# Patient Record
Sex: Male | Born: 1945 | Race: Black or African American | Hispanic: No | Marital: Single | Smoking: Never smoker
Health system: Southern US, Community
[De-identification: ages and names within clinical notes are randomized; demographics above are authoritative.]

---

## 2018-01-02 ENCOUNTER — Ambulatory Visit: Payer: Self-pay | Admitting: Urology

## 2018-03-17 ENCOUNTER — Ambulatory Visit (INDEPENDENT_AMBULATORY_CARE_PROVIDER_SITE_OTHER): Payer: Medicare Other | Admitting: Urology

## 2018-03-17 DIAGNOSIS — N401 Enlarged prostate with lower urinary tract symptoms: Secondary | ICD-10-CM

## 2018-03-17 DIAGNOSIS — C61 Malignant neoplasm of prostate: Secondary | ICD-10-CM

## 2018-03-17 DIAGNOSIS — R3912 Poor urinary stream: Secondary | ICD-10-CM

## 2018-03-17 DIAGNOSIS — N5201 Erectile dysfunction due to arterial insufficiency: Secondary | ICD-10-CM | POA: Diagnosis not present

## 2018-03-19 ENCOUNTER — Other Ambulatory Visit: Payer: Self-pay | Admitting: Urology

## 2018-03-19 DIAGNOSIS — C61 Malignant neoplasm of prostate: Secondary | ICD-10-CM

## 2018-05-27 ENCOUNTER — Ambulatory Visit
Admission: RE | Admit: 2018-05-27 | Discharge: 2018-05-27 | Disposition: A | Discharge status: Home / Self Care | Payer: Medicare Other | Source: Ambulatory Visit | Attending: Urology | Admitting: Urology

## 2018-05-27 DIAGNOSIS — C61 Malignant neoplasm of prostate: Secondary | ICD-10-CM

## 2019-03-09 ENCOUNTER — Ambulatory Visit: Payer: Medicare Other | Admitting: Urology

## 2019-04-20 ENCOUNTER — Other Ambulatory Visit: Payer: Self-pay

## 2019-04-20 ENCOUNTER — Other Ambulatory Visit: Payer: Self-pay | Admitting: Urology

## 2019-04-20 ENCOUNTER — Ambulatory Visit (INDEPENDENT_AMBULATORY_CARE_PROVIDER_SITE_OTHER): Payer: Medicare Other | Admitting: Urology

## 2019-04-20 VITALS — BP 162/92 | HR 77 | Temp 97.9°F | Ht 63.0 in | Wt 141.0 lb

## 2019-04-20 DIAGNOSIS — R972 Elevated prostate specific antigen [PSA]: Secondary | ICD-10-CM

## 2019-04-20 DIAGNOSIS — Z7689 Persons encountering health services in other specified circumstances: Secondary | ICD-10-CM | POA: Diagnosis not present

## 2019-04-20 LAB — PSA: PSA: 14 ng/mL — ABNORMAL HIGH (ref <=4.0)

## 2019-04-20 LAB — POCT URINALYSIS DIPSTICK
Blood, UA: NEGATIVE
Glucose, UA: NEGATIVE
Ketones, UA: NEGATIVE
Leukocytes, UA: NEGATIVE
Nitrite, UA: NEGATIVE
Protein, UA: NEGATIVE
Spec Grav, UA: 1.025 (ref 1.010–1.025)
Urobilinogen, UA: NEGATIVE E.U./dL — AB
pH, UA: 7 (ref 5.0–8.0)

## 2019-04-20 MED ORDER — LEVOFLOXACIN 750 MG PO TABS: 750.0000 mg | ORAL_TABLET | Freq: Every day | ORAL | 0 refills | Status: AC

## 2019-04-20 NOTE — Patient Instructions (Signed)
Prostate Biopsy Instructions  Stop all aspirin or blood thinners (aspirin, plavix, coumadin, warfarin, motrin, ibuprofen, advil, aleve, naproxen, naprosyn) for 7 days prior to the procedure.  If you have any questions about stopping these medications, please contact your primary care physician or cardiologist.  Having a light meal prior to the procedure is recommended.  If you are diabetic or have low blood sugar please bring a small snack or glucose tablet.  A Fleets enema is needed to be purchased over the counter at a local pharmacy and used 2 hours before you scheduled appointment.  This can be purchased over the counter at any pharmacy.  Antibiotics will be administered in the clinic at the time of the procedure unless otherwise specified.    Please bring someone with you to the procedure to drive you home.  A follow up appointment has been scheduled for you to receive the results of the biopsy.  If you have any questions or concerns, please feel free to call the office at [phone] or send a Mychart message.    Thank you, Staff at [facility]

## 2019-04-20 NOTE — Progress Notes (Signed)
H&P  Chief Complaint: Prostate Cancer  History of Present Illness:   1.12.2021: Here today for follow-up having had no treatment or management of his prostate cancer following diagnosis several years prior. He is here today largely on recommendation of his PCP. He was unable to get prostate MRI due to claustrophobia.   IPSS Questionnaire (AUA-7): Over the past month.   1)  How often have you had a sensation of not emptying your bladder completely after you finish urinating?  0 - Not at all  2)  How often have you had to urinate again less than two hours after you finished urinating? 2 - Less than half the time  3)  How often have you found you stopped and started again several times when you urinated?  0 - Not at all  4) How difficult have you found it to postpone urination?  0 - Not at all  5) How often have you had a weak urinary stream?  0 - Not at all  6) How often have you had to push or strain to begin urination?  0 - Not at all  7) How many times did you most typically get up to urinate from the time you went to bed until the time you got up in the morning?  2 - 2 times  Total score:  0-7 mildly symptomatic   8-19 moderately symptomatic   20-35 severely symptomatic     (below copied from [name]):  Danny Jordan is a 74 year-old male patient who was referred by Dr. Weldon Picking C. 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  The patient complains of lower urinary tract symptom(s) that include frequency, weak stream, intermittency, straining, and sense of incomplete emptying. The patient states his most bothersome symptom(s) are the following: sense of incomplete emptying. The patient states if he were to spend the rest of his life with his current urinary condition, he would be unhappy.  He does not have the pathology report from his biopsy.   I do not have complete records, but apparently the patient underwent ultrasound and  biopsy of his prostate by Dr. Exie Parody before 2017. At that time, PSA was elevated around 15. Prostatic volume was 96 mL. I do not have results of pathology, but apparently he had GS 3+3 pattern. He was referred for radiotherapy but it does not seem he ever underwent this. He has not been followed since 2017. Most recent PSA was 14.9 in August a of this year.     Home Medications:  Allergies as of 04/20/2019      Reactions   Medical Provider Ez Flu Shot [influenza Vac Split Quad]       Medication List       Accurate as of April 20, 2019 11:59 AM. If you have any questions, ask your nurse or doctor.        omeprazole 20 MG capsule Commonly known as: PRILOSEC Take 20 mg by mouth daily.   tamsulosin 0.4 MG Caps capsule Commonly known as: FLOMAX       Allergies:  Allergies  Allergen Reactions  . Medical Provider Ez Flu Shot [Influenza Vac Split Quad]     No family history on file.  Social History:  has no history on file for tobacco, alcohol, and drug.  ROS: A complete review of systems was performed.  All systems are negative except for pertinent findings as noted.  Physical Exam:  Vital signs in last 24 hours: BP (!) 162/92   Pulse 77   Temp 97.9 F (36.6 C)   Ht 5\' 3"  (1.6 m)   Wt 141 lb (64 kg)   BMI 24.98 kg/m  Constitutional:  Alert and oriented, No acute distress Cardiovascular: Regular rate  Respiratory: Normal respiratory effort GI: Abdomen is soft, nontender, nondistended, no abdominal masses. No CVAT. Bilateral inguinal hernias. Genitourinary: Normal male phallus (uncircumcised), testes are descended bilaterally and non-tender and without masses, scrotum is normal in appearance without lesions or masses, perineum is normal on inspection. Prostate feels around 80 grams, no nodules. Lymphatic: No lymphadenopathy Neurologic: Grossly intact, no focal deficits Psychiatric: Normal mood and affect  Laboratory Data:  No results for input(s): WBC, HGB, HCT, PLT  in the last 72 hours.  No results for input(s): NA, K, CL, GLUCOSE, BUN, CALCIUM, CREATININE in the last 72 hours.  Invalid input(s): CO3   Results for orders placed or performed in visit on 04/20/19 (from the past 24 hour(s))  POCT urinalysis dipstick     Status: Abnormal   Collection Time: 04/20/19 11:01 AM  Result Value Ref Range   Color, UA yellow    Clarity, UA clear    Glucose, UA Negative Negative   Bilirubin, UA small    Ketones, UA neg    Spec Grav, UA 1.025 1.010 - 1.025   Blood, UA neg    pH, UA 7.0 5.0 - 8.0   Protein, UA Negative Negative   Urobilinogen, UA negative (A) 0.2 or 1.0 E.U./dL   Nitrite, UA neg    Leukocytes, UA Negative Negative   Appearance clear    Odor     No results found for this or any previous visit (from the past 240 hour(s)).  Renal Function: No results for input(s): CREATININE in the last 168 hours. CrCl cannot be calculated (No successful lab value found.).  Radiologic Imaging: No results found.  Impression/Assessment:  He has had no management of his PCa since diagnosis prior to 2017. He also has not had recent PSA.   Plan:  1. PSA today -- will forward results  2. Will forgo MRI for now given he was entirely unable to tolerate his previous one.  3. Prostate [place] bx -- will call to schedule. Given instructions per nurse and we discussed the procedure as well as its possible risks and complications.

## 2019-04-21 ENCOUNTER — Other Ambulatory Visit: Payer: Self-pay | Admitting: Urology

## 2019-04-23 NOTE — Progress Notes (Signed)
Called. No answer. No way to leave message.

## 2019-04-27 ENCOUNTER — Telehealth: Payer: Self-pay

## 2019-04-27 NOTE — Telephone Encounter (Signed)
-----   Message from Franchot Gallo, MD sent at 04/23/2019 12:07 PM EST ----- Notify pt psa still elevated --14--will proceed with Bx

## 2019-04-27 NOTE — Progress Notes (Signed)
Called pt again. No answer. Letter mailed.

## 2019-05-18 ENCOUNTER — Other Ambulatory Visit: Payer: Self-pay

## 2019-05-18 ENCOUNTER — Other Ambulatory Visit: Payer: Self-pay | Admitting: Urology

## 2019-05-18 ENCOUNTER — Ambulatory Visit (HOSPITAL_COMMUNITY)
Admission: RE | Admit: 2019-05-18 | Discharge: 2019-05-18 | Disposition: A | Discharge status: Home / Self Care | Payer: Medicare Other | Source: Ambulatory Visit | Attending: Urology | Admitting: Urology

## 2019-05-18 DIAGNOSIS — R972 Elevated prostate specific antigen [PSA]: Secondary | ICD-10-CM | POA: Insufficient documentation

## 2019-05-18 DIAGNOSIS — Z7689 Persons encountering health services in other specified circumstances: Secondary | ICD-10-CM | POA: Diagnosis not present

## 2019-05-18 MED ORDER — GENTAMICIN SULFATE 40 MG/ML IJ SOLN
160.0000 mg | Freq: Once | INTRAMUSCULAR | Status: AC
  Administered 2019-05-18: 160 mg via INTRAMUSCULAR

## 2019-05-18 MED ORDER — GENTAMICIN SULFATE 40 MG/ML IJ SOLN
INTRAMUSCULAR | Status: AC
  Filled 2019-05-18: qty 4

## 2019-05-18 MED ORDER — LIDOCAINE HCL (PF) 2 % IJ SOLN
INTRAMUSCULAR | Status: AC
  Filled 2019-05-18: qty 10

## 2019-05-18 NOTE — Op Note (Signed)
This man presents for TRUS/Bx.  Reason for procedure: Prostate cancer surveillance Prostate exam: 100 gm benign  Appropriate timeout was performed. The patient was placed in the left lateral decubitus position.  Transrectal ultrasound probe was passed without difficulty. Prostate was scanned in transverse and sagittal dimensions.  Volume was obtained--130 ml. Ultrasound findings: Low bladder volume. No hypoechoic areas. Large central adenoma. Using a spinal needle, 10 mL of 2% plain lidocaine was utilized to infiltrate the lateral aspect of the seminal vesicles bilaterally. Following this, 12 cores were taken using the biopsy gun, and the following distribution: Right base lateral, right base medial, right mid lateral, right mid medial, right apex lateral, right apex medial, left base lateral, left base medial, left mid lateral, left mid medial, left apex lateral, left apex medial.  These were all sent for pathologic review separately in formalin.  At this point, the probe was removed.  Pressure was placed on the anal area for approximately 1 minute.  After establishing no significant bleeding, the procedure was then terminated.  The patient tolerated the procedure well. He was given post-boipsy instructions. We will call with pathology results and followup.

## 2019-07-27 ENCOUNTER — Other Ambulatory Visit: Payer: Self-pay

## 2019-07-27 ENCOUNTER — Ambulatory Visit (INDEPENDENT_AMBULATORY_CARE_PROVIDER_SITE_OTHER): Payer: Medicare Other | Admitting: Urology

## 2019-07-27 ENCOUNTER — Encounter: Payer: Self-pay | Admitting: Urology

## 2019-07-27 VITALS — BP 134/72 | HR 83 | Temp 98.8°F | Ht 63.0 in | Wt 141.0 lb

## 2019-07-27 DIAGNOSIS — C61 Malignant neoplasm of prostate: Secondary | ICD-10-CM

## 2019-07-27 DIAGNOSIS — R972 Elevated prostate specific antigen [PSA]: Secondary | ICD-10-CM | POA: Diagnosis not present

## 2019-07-27 DIAGNOSIS — Z7689 Persons encountering health services in other specified circumstances: Secondary | ICD-10-CM | POA: Diagnosis not present

## 2019-07-27 LAB — POCT URINALYSIS DIPSTICK
Blood, UA: NEGATIVE
Glucose, UA: NEGATIVE
Ketones, UA: NEGATIVE
Leukocytes, UA: NEGATIVE
Nitrite, UA: NEGATIVE
Protein, UA: POSITIVE — AB
Spec Grav, UA: >=1.03 — AB (ref 1.010–1.025)
Urobilinogen, UA: NEGATIVE E.U./dL — AB
pH, UA: 5 (ref 5.0–8.0)

## 2019-07-27 MED ORDER — TAMSULOSIN HCL 0.4 MG PO CAPS: 0.4000 mg | ORAL_CAPSULE | Freq: Every day | ORAL | 3 refills | Status: DC

## 2019-07-27 NOTE — Progress Notes (Signed)
H&P  Chief Complaint: Prostate Cancer + BPH  History of Present Illness:   4.20.2021: Here today for follow-up. He underwent repeat TRUSP/bx 2 mo ago, which revealed only 1/12 cores 10% positive for Gleason 3+3 cancer (very low risk group).   (below copied from [facility] records):  BPH:  Dagem Lazer is a 74 year-old male patient who was referred by Dr. Weldon Picking C. 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  The patient complains of lower urinary tract symptom(s) that include frequency, weak stream, intermittency, straining, and sense of incomplete emptying. The patient states his most bothersome symptom(s) are the following: sense of incomplete emptying. The patient states if he were to spend the rest of his life with his current urinary condition, he would be unhappy.   He is on tamsulosin. IPSS 16, quality of Life score 5   Prostate Cancer: He does not have the pathology report from his biopsy.   I do not have complete records, but apparently the patient underwent ultrasound and biopsy of his prostate by Dr. Exie Parody before 2017. At that time, PSA was elevated around 15. Prostatic volume was 96 mL. I do not have results of pathology, but apparently he had GS 3+3 pattern. He was referred for radiotherapy but it does not seem he ever underwent this. He has not been followed since 2017. Most recent PSA was 14.9 in August a of this year.   ED: He does have problems obtaining and maintaining erections. No curvature of pain with erections. Libido is fairly normal. He would like to consider therapy.  No past medical history on file.   Home Medications:  Allergies as of 07/27/2019      Reactions   Medical Provider Ez Flu Shot [influenza Vac Split Quad]       Medication List       Accurate as of July 27, 2019  3:29 PM. If you have any questions, ask your nurse or doctor.        omeprazole 20 MG capsule Commonly known as: PRILOSEC Take 20  mg by mouth daily.   tamsulosin 0.4 MG Caps capsule Commonly known as: FLOMAX       Allergies:  Allergies  Allergen Reactions  . Medical Provider Ez Flu Shot [Influenza Vac Split Quad]     No family history on file.  Social History:  reports that he has never smoked. He has never used smokeless tobacco. No history on file for alcohol and drug.  ROS: Urological Symptom Review Patient is experiencing the following symptoms: None Review of Systems Gastrointestinal (upper)  : Indigestion/heartburn Gastrointestinal (lower) : Negative for lower GI symptoms Constitutional : Negative for symptoms Skin: Negative for skin symptoms Eyes: Negative for eye symptoms Ear/Nose/Throat : Negative for Ear/Nose/Throat symptoms Hematologic/Lymphatic: Negative for Hematologic/Lymphatic symptoms Cardiovascular : Negative for cardiovascular symptoms Respiratory : Negative for respiratory symptoms Endocrine: Negative for endocrine symptoms Musculoskeletal: Negative for musculoskeletal symptoms Neurological: Negative for neurological symptoms Psychologic: Negative for psychiatric symptoms    Physical Exam:  Vital signs in last 24 hours: BP 134/72   Pulse 83   Temp 98.8 F (37.1 C)   Ht 5\' 3"  (1.6 m)   Wt 141 lb (64 kg)   BMI 24.98 kg/m  Constitutional:  Alert and oriented, No acute distress Cardiovascular: Regular rate  Respiratory: Normal respiratory effort Lymphatic: No lymphadenopathy Neurologic: Grossly intact, no focal deficits Psychiatric: Normal mood and affect    Results for  orders placed or performed in visit on 07/27/19 (from the past 24 hour(s))  POCT urinalysis dipstick     Status: Abnormal   Collection Time: 07/27/19  2:34 PM  Result Value Ref Range   Color, UA dk. yellow    Clarity, UA clear    Glucose, UA Negative Negative   Bilirubin, UA small    Ketones, UA neg    Spec Grav, UA >=1.030 (A) 1.010 - 1.025   Blood, UA neg    pH, UA 5.0 5.0 - 8.0    Protein, UA Positive (A) Negative   Urobilinogen, UA negative (A) 0.2 or 1.0 E.U./dL   Nitrite, UA neg    Leukocytes, UA Negative Negative   Appearance clear    Odor      I have reviewed prior pt notes  I have reviewed notes from referring/previous physicians  I have reviewed urinalysis results  I have independently reviewed prior imaging  I have reviewed prior PSA/Bx results  Impression/Assessment:  His repeat bx this last February revealed only very low volume, very low risk prostate cancer (1/12 cores, 10%, 3+3). We will continue to monitor this.Marland Kitchen He is absolutely unable to tolerate MRI.  Plan:  1. Return in 6 mo for OV w/ PSA -- he prefers this over annual follow-up.   2. We will continue on active surveillance, though he was assured that this cancer will likely never be a significant health risk for him.

## 2019-07-27 NOTE — Addendum Note (Signed)
Addended by: Franchot Gallo on: 07/27/2019 03:41 PM   Modules accepted: Orders

## 2019-07-27 NOTE — Progress Notes (Signed)
See H& P.

## 2019-09-14 ENCOUNTER — Other Ambulatory Visit: Payer: Self-pay

## 2019-09-14 ENCOUNTER — Ambulatory Visit (INDEPENDENT_AMBULATORY_CARE_PROVIDER_SITE_OTHER): Payer: Medicare Other | Admitting: Urology

## 2019-09-14 ENCOUNTER — Encounter: Payer: Self-pay | Admitting: Urology

## 2019-09-14 VITALS — BP 144/81 | HR 70 | Temp 97.9°F | Ht 63.0 in | Wt 140.0 lb

## 2019-09-14 DIAGNOSIS — Z7689 Persons encountering health services in other specified circumstances: Secondary | ICD-10-CM | POA: Diagnosis not present

## 2019-09-14 DIAGNOSIS — R972 Elevated prostate specific antigen [PSA]: Secondary | ICD-10-CM

## 2019-09-14 LAB — POCT URINALYSIS DIPSTICK
Bilirubin, UA: NEGATIVE
Blood, UA: NEGATIVE
Glucose, UA: NEGATIVE
Ketones, UA: NEGATIVE
Nitrite, UA: NEGATIVE
Protein, UA: NEGATIVE
Spec Grav, UA: 1.025 (ref 1.010–1.025)
Urobilinogen, UA: NEGATIVE E.U./dL — AB
pH, UA: 5 (ref 5.0–8.0)

## 2019-09-14 NOTE — Addendum Note (Signed)
Addended by: Franchot Gallo on: 09/14/2019 02:42 PM   Modules accepted: Orders

## 2019-09-14 NOTE — Progress Notes (Signed)

## 2019-09-14 NOTE — Progress Notes (Signed)
H&P  Chief Complaint: Elevated PSA  History of Present Illness:   6.8.2021: Originally scheduled for 6 mo follow-up, he returns today just 2 months out from last visit. He continues on tamsulosin but reports that he has recently run out of this rx and was unable to have this refilled.   IPSS Questionnaire (AUA-7): Over the past month.   1)  How often have you had a sensation of not emptying your bladder completely after you finish urinating?  0 - Not at all  2)  How often have you had to urinate again less than two hours after you finished urinating? 1 - Less than 1 time in 5  3)  How often have you found you stopped and started again several times when you urinated?  0 - Not at all  4) How difficult have you found it to postpone urination?  0 - Not at all  5) How often have you had a weak urinary stream?  0 - Not at all  6) How often have you had to push or strain to begin urination?  0 - Not at all  7) How many times did you most typically get up to urinate from the time you went to bed until the time you got up in the morning?  4 - 4 times  Total score:  0-7 mildly symptomatic   8-19 moderately symptomatic   20-35 severely symptomatic   Total: 5 QoL: 1  No past medical history on file.    Home Medications:  Allergies as of 09/14/2019      Reactions   Medical Provider Ez Flu Shot [influenza Vac Split Quad]       Medication List       Accurate as of September 14, 2019  2:39 PM. If you have any questions, ask your nurse or doctor.        omeprazole 20 MG capsule Commonly known as: PRILOSEC Take 20 mg by mouth daily.   tamsulosin 0.4 MG Caps capsule Commonly known as: FLOMAX Take 1 capsule (0.4 mg total) by mouth daily after supper.       Allergies:  Allergies  Allergen Reactions  . Medical Provider Ez Flu Shot [Influenza Vac Split Quad]     No family history on file.  Social History:  reports that he has never smoked. He has never used smokeless tobacco. No history  on file for alcohol and drug.  ROS: A complete review of systems was performed.  All systems are negative except for pertinent findings as noted.  Physical Exam:  Vital signs in last 24 hours: BP (!) 144/81   Pulse 70   Temp 97.9 F (36.6 C)   Ht 5\' 3"  (1.6 m)   Wt 140 lb (63.5 kg)   BMI 24.80 kg/m  Constitutional:  Alert and oriented, No acute distress Cardiovascular: Regular rate  Respiratory: Normal respiratory effort  Genitourinary: Prostate feels around 100 grams, no nodularity. Lymphatic: No lymphadenopathy Neurologic: Grossly intact, no focal deficits Psychiatric: Normal mood and affect  I have reviewed prior pt notes  I have reviewed notes from referring/previous physicians  I have reviewed urinalysis results  I have reviewed prior PSA results  Impression/Assessment:  Stable urinary sx's since last seen which was just a month ago. Despite being off tamsulosin for some time now, he is not really having any significant urinary complaints. He may be able to just fully discontinue this medication.  Plan:  1. Advised to try remaining off tamsulosin  seeing as his urinary sx's are stable and non-bothersome  2. Cancel appointment in 4 mo --> he will return for OV in 6 mo    3. PSA today

## 2019-09-15 LAB — PSA: PSA: 51 ng/mL — ABNORMAL HIGH (ref <=4.0)

## 2019-09-21 ENCOUNTER — Other Ambulatory Visit: Payer: Self-pay | Admitting: Urology

## 2019-09-27 ENCOUNTER — Other Ambulatory Visit: Payer: Self-pay | Admitting: Urology

## 2019-09-27 DIAGNOSIS — R972 Elevated prostate specific antigen [PSA]: Secondary | ICD-10-CM

## 2019-09-27 MED ORDER — SULFAMETHOXAZOLE-TRIMETHOPRIM 800-160 MG PO TABS: 1.0000 | ORAL_TABLET | Freq: Two times a day (BID) | ORAL | 0 refills | Status: AC

## 2019-09-28 ENCOUNTER — Telehealth: Payer: Self-pay

## 2019-09-28 NOTE — Telephone Encounter (Signed)
Tried to reach pt again calling 3 different numbers listed . One wrong numbers. One try again later and one out of service. Unable to reach pt.

## 2019-09-28 NOTE — Progress Notes (Signed)
PSA orders faxed to [place].

## 2019-09-28 NOTE — Telephone Encounter (Signed)
Attempted to call. Phone msg said unavailable try call later. No way to leave msg.

## 2019-10-01 DIAGNOSIS — Z789 Other specified health status: Secondary | ICD-10-CM | POA: Diagnosis not present

## 2019-10-01 DIAGNOSIS — R05 Cough: Secondary | ICD-10-CM | POA: Diagnosis not present

## 2019-10-01 DIAGNOSIS — N390 Urinary tract infection, site not specified: Secondary | ICD-10-CM | POA: Diagnosis not present

## 2019-10-01 DIAGNOSIS — Z299 Encounter for prophylactic measures, unspecified: Secondary | ICD-10-CM | POA: Diagnosis not present

## 2019-10-01 DIAGNOSIS — Z7689 Persons encountering health services in other specified circumstances: Secondary | ICD-10-CM | POA: Diagnosis not present

## 2019-10-06 NOTE — Telephone Encounter (Signed)
-----   Message from Franchot Gallo, MD sent at 09/27/2019  8:51 PM EDT ----- Call pt--PSA up to 51--may well be due to infection. I sent abx in--take it for 2 weeks, I also put order in for repeat PSA in 3 weeks ----- Message ----- From: Iris Pert, LPN Sent: [id number]   8:51 AM EDT To: Franchot Gallo, MD  Please review

## 2019-10-06 NOTE — Telephone Encounter (Signed)
Letter mailed to pt at address on file.

## 2019-11-03 DIAGNOSIS — Z7689 Persons encountering health services in other specified circumstances: Secondary | ICD-10-CM | POA: Diagnosis not present

## 2019-11-03 DIAGNOSIS — Z79899 Other long term (current) drug therapy: Secondary | ICD-10-CM | POA: Diagnosis not present

## 2019-11-03 DIAGNOSIS — Z7189 Other specified counseling: Secondary | ICD-10-CM | POA: Diagnosis not present

## 2019-11-03 DIAGNOSIS — Z0000 Encounter for general adult medical examination without abnormal findings: Secondary | ICD-10-CM | POA: Diagnosis not present

## 2019-11-03 DIAGNOSIS — Z299 Encounter for prophylactic measures, unspecified: Secondary | ICD-10-CM | POA: Diagnosis not present

## 2019-11-03 DIAGNOSIS — R5383 Other fatigue: Secondary | ICD-10-CM | POA: Diagnosis not present

## 2019-11-03 DIAGNOSIS — E7800 Pure hypercholesterolemia, unspecified: Secondary | ICD-10-CM | POA: Diagnosis not present

## 2019-12-26 DIAGNOSIS — J9601 Acute respiratory failure with hypoxia: Secondary | ICD-10-CM | POA: Diagnosis not present

## 2019-12-26 DIAGNOSIS — R0602 Shortness of breath: Secondary | ICD-10-CM | POA: Diagnosis not present

## 2019-12-26 DIAGNOSIS — E7800 Pure hypercholesterolemia, unspecified: Secondary | ICD-10-CM | POA: Diagnosis not present

## 2019-12-26 DIAGNOSIS — J90 Pleural effusion, not elsewhere classified: Secondary | ICD-10-CM | POA: Diagnosis not present

## 2019-12-26 DIAGNOSIS — U071 COVID-19: Secondary | ICD-10-CM | POA: Diagnosis not present

## 2019-12-26 DIAGNOSIS — R0902 Hypoxemia: Secondary | ICD-10-CM | POA: Diagnosis not present

## 2019-12-26 DIAGNOSIS — R509 Fever, unspecified: Secondary | ICD-10-CM | POA: Diagnosis not present

## 2019-12-26 DIAGNOSIS — J1282 Pneumonia due to coronavirus disease 2019: Secondary | ICD-10-CM | POA: Diagnosis not present

## 2019-12-27 DIAGNOSIS — R0902 Hypoxemia: Secondary | ICD-10-CM | POA: Diagnosis not present

## 2019-12-28 DIAGNOSIS — R0902 Hypoxemia: Secondary | ICD-10-CM | POA: Diagnosis not present

## 2019-12-29 DIAGNOSIS — R0902 Hypoxemia: Secondary | ICD-10-CM | POA: Diagnosis not present

## 2019-12-30 DIAGNOSIS — R0902 Hypoxemia: Secondary | ICD-10-CM | POA: Diagnosis not present

## 2019-12-31 DIAGNOSIS — J90 Pleural effusion, not elsewhere classified: Secondary | ICD-10-CM | POA: Diagnosis not present

## 2019-12-31 DIAGNOSIS — R0602 Shortness of breath: Secondary | ICD-10-CM | POA: Diagnosis not present

## 2019-12-31 DIAGNOSIS — R0902 Hypoxemia: Secondary | ICD-10-CM | POA: Diagnosis not present

## 2020-01-03 DIAGNOSIS — R0902 Hypoxemia: Secondary | ICD-10-CM | POA: Diagnosis not present

## 2020-01-04 DIAGNOSIS — R0902 Hypoxemia: Secondary | ICD-10-CM | POA: Diagnosis not present

## 2020-01-05 DIAGNOSIS — J90 Pleural effusion, not elsewhere classified: Secondary | ICD-10-CM | POA: Diagnosis not present

## 2020-01-05 DIAGNOSIS — R0902 Hypoxemia: Secondary | ICD-10-CM | POA: Diagnosis not present

## 2020-01-06 DIAGNOSIS — R0902 Hypoxemia: Secondary | ICD-10-CM | POA: Diagnosis not present

## 2020-01-07 DIAGNOSIS — R0902 Hypoxemia: Secondary | ICD-10-CM | POA: Diagnosis not present

## 2020-01-19 DIAGNOSIS — Z6823 Body mass index (BMI) 23.0-23.9, adult: Secondary | ICD-10-CM | POA: Diagnosis not present

## 2020-01-19 DIAGNOSIS — E7800 Pure hypercholesterolemia, unspecified: Secondary | ICD-10-CM | POA: Diagnosis not present

## 2020-01-19 DIAGNOSIS — Z299 Encounter for prophylactic measures, unspecified: Secondary | ICD-10-CM | POA: Diagnosis not present

## 2020-01-19 DIAGNOSIS — Z8616 Personal history of COVID-19: Secondary | ICD-10-CM | POA: Diagnosis not present

## 2020-03-21 ENCOUNTER — Ambulatory Visit: Payer: Medicare Other | Admitting: Urology

## 2020-03-21 NOTE — Progress Notes (Incomplete)
H&P  Chief Complaint: Elevated PSA  History of Present Illness:   12.14.2021:  (below copied from [place] records):  1.12.2021: Danny Jordan is a 74 year-old male patient who was referred by Dr. Weldon Picking C. 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  BPH: The patient complains of lower urinary tract symptom(s) that include frequency, weak stream, intermittency, straining, and sense of incomplete emptying. The patient states his most bothersome symptom(s) are the following: sense of incomplete emptying. The patient states if he were to spend the rest of his life with his current urinary condition, he would be unhappy.   He is on tamsulosin. IPSS 16, quality of Life score 5   Prostate Cancer: He does not have the pathology report from his biopsy.   I do not have complete records, but apparently the patient underwent ultrasound and biopsy of his prostate by Dr. Exie Parody before 2017. At that time, PSA was elevated around 15. Prostatic volume was 96 mL. I do not have results of pathology, but apparently he had GS 3+3 pattern. He was referred for radiotherapy but it does not seem he ever underwent this. He has not been followed since 2017. Most recent PSA was 14.9 in August a of this year.   ED: He does have problems obtaining and maintaining erections. No curvature of pain with erections. Libido is fairly normal. He would like to consider therapy.  4.20.2021: Here today for follow-up. He underwent repeat TRUSP/bx 2 mo ago, which revealed only 1/12 cores 10% positive for Gleason 3+3 cancer (very low risk group).   6.8.2021: Originally scheduled for 6 mo follow-up, he returns today just 2 months out from last visit. He continues on tamsulosin but reports that he has recently run out of this rx and was unable to have this refilled.   Home Medications:  Allergies as of 03/21/2020      Reactions   Medical Provider Ez Flu Shot [influenza Vac Split  Quad]       Medication List       Accurate as of March 21, 2020 12:53 PM. If you have any questions, ask your nurse or doctor.        omeprazole 20 MG capsule Commonly known as: PRILOSEC Take 20 mg by mouth daily.   sulfamethoxazole-trimethoprim 800-160 MG tablet Commonly known as: BACTRIM DS Take 1 tablet by mouth 2 (two) times daily.   tamsulosin 0.4 MG Caps capsule Commonly known as: FLOMAX Take 1 capsule (0.4 mg total) by mouth daily after supper.       Allergies:  Allergies  Allergen Reactions  . Medical Provider Ez Flu Shot [Influenza Vac Split Quad]     No family history on file.  Social History:  reports that he has never smoked. He has never used smokeless tobacco. No history on file for alcohol use and drug use.  ROS: A complete review of systems was performed.  All systems are negative except for pertinent findings as noted.  Physical Exam:  Vital signs in last 24 hours: There were no vitals taken for this visit. Constitutional:  Alert and oriented, No acute distress Cardiovascular: Regular rate  Respiratory: Normal respiratory effort GI: Abdomen is soft, nontender, nondistended, no abdominal masses. No CVAT.  Genitourinary: Normal male phallus, testes are descended bilaterally and non-tender and without masses, scrotum is normal in appearance without lesions or masses, perineum is normal on inspection. Lymphatic: No lymphadenopathy Neurologic: Grossly intact, no focal  deficits Psychiatric: Normal mood and affect  Laboratory Data:  No results for input(s): WBC, HGB, HCT, PLT in the last 72 hours.  No results for input(s): NA, K, CL, GLUCOSE, BUN, CALCIUM, CREATININE in the last 72 hours.  Invalid input(s): CO3   No results found for this or any previous visit (from the past 24 hour(s)). No results found for this or any previous visit (from the past 240 hour(s)).  Renal Function: No results for input(s): CREATININE in the last 168  hours. CrCl cannot be calculated (No successful lab value found.).  Radiologic Imaging: No results found.  Impression/Assessment:  ***  Plan:  ***

## 2020-05-22 DIAGNOSIS — Z299 Encounter for prophylactic measures, unspecified: Secondary | ICD-10-CM | POA: Diagnosis not present

## 2020-05-22 DIAGNOSIS — E7800 Pure hypercholesterolemia, unspecified: Secondary | ICD-10-CM | POA: Diagnosis not present

## 2020-05-22 DIAGNOSIS — Z8616 Personal history of COVID-19: Secondary | ICD-10-CM | POA: Diagnosis not present

## 2020-05-22 DIAGNOSIS — Z789 Other specified health status: Secondary | ICD-10-CM | POA: Diagnosis not present

## 2020-08-09 DIAGNOSIS — Z6824 Body mass index (BMI) 24.0-24.9, adult: Secondary | ICD-10-CM | POA: Diagnosis not present

## 2020-08-09 DIAGNOSIS — Z79899 Other long term (current) drug therapy: Secondary | ICD-10-CM | POA: Diagnosis not present

## 2020-08-09 DIAGNOSIS — Z299 Encounter for prophylactic measures, unspecified: Secondary | ICD-10-CM | POA: Diagnosis not present

## 2020-08-09 DIAGNOSIS — Z7189 Other specified counseling: Secondary | ICD-10-CM | POA: Diagnosis not present

## 2020-08-09 DIAGNOSIS — E7800 Pure hypercholesterolemia, unspecified: Secondary | ICD-10-CM | POA: Diagnosis not present

## 2020-08-09 DIAGNOSIS — Z0000 Encounter for general adult medical examination without abnormal findings: Secondary | ICD-10-CM | POA: Diagnosis not present

## 2020-08-09 DIAGNOSIS — R5383 Other fatigue: Secondary | ICD-10-CM | POA: Diagnosis not present

## 2020-08-30 DIAGNOSIS — H40013 Open angle with borderline findings, low risk, bilateral: Secondary | ICD-10-CM | POA: Diagnosis not present

## 2020-08-30 DIAGNOSIS — Z7689 Persons encountering health services in other specified circumstances: Secondary | ICD-10-CM | POA: Diagnosis not present

## 2020-08-30 DIAGNOSIS — H25812 Combined forms of age-related cataract, left eye: Secondary | ICD-10-CM | POA: Diagnosis not present

## 2020-08-30 DIAGNOSIS — Z961 Presence of intraocular lens: Secondary | ICD-10-CM | POA: Diagnosis not present

## 2020-10-11 DIAGNOSIS — Z961 Presence of intraocular lens: Secondary | ICD-10-CM | POA: Diagnosis not present

## 2020-10-11 DIAGNOSIS — H40013 Open angle with borderline findings, low risk, bilateral: Secondary | ICD-10-CM | POA: Diagnosis not present

## 2020-10-11 DIAGNOSIS — Z7689 Persons encountering health services in other specified circumstances: Secondary | ICD-10-CM | POA: Diagnosis not present

## 2020-10-11 DIAGNOSIS — H25812 Combined forms of age-related cataract, left eye: Secondary | ICD-10-CM | POA: Diagnosis not present

## 2020-10-17 ENCOUNTER — Ambulatory Visit: Payer: Medicare Other | Admitting: Urology

## 2020-10-17 ENCOUNTER — Other Ambulatory Visit: Payer: Self-pay | Admitting: Urology

## 2021-02-08 DIAGNOSIS — R5383 Other fatigue: Secondary | ICD-10-CM | POA: Diagnosis not present

## 2021-02-08 DIAGNOSIS — E7800 Pure hypercholesterolemia, unspecified: Secondary | ICD-10-CM | POA: Diagnosis not present

## 2021-02-08 DIAGNOSIS — Z23 Encounter for immunization: Secondary | ICD-10-CM | POA: Diagnosis not present

## 2021-02-08 DIAGNOSIS — Z6824 Body mass index (BMI) 24.0-24.9, adult: Secondary | ICD-10-CM | POA: Diagnosis not present

## 2021-02-08 DIAGNOSIS — Z299 Encounter for prophylactic measures, unspecified: Secondary | ICD-10-CM | POA: Diagnosis not present

## 2021-05-19 NOTE — Progress Notes (Incomplete)
History of Present Illness: ***  No past medical history on file.  *** The histories are not reviewed yet. Please review them in the "History" navigator section and refresh this [place].  Home Medications:  Allergies as of 05/22/2021       Reactions   Medical Provider Ez Flu Shot [influenza Vac Split Quad]         Medication List        Accurate as of May 19, 2021  8:09 PM. If you have any questions, ask your nurse or doctor.          omeprazole 20 MG capsule Commonly known as: PRILOSEC Take 20 mg by mouth daily.   sulfamethoxazole-trimethoprim 800-160 MG tablet Commonly known as: BACTRIM DS Take 1 tablet by mouth 2 (two) times daily.   tamsulosin 0.4 MG Caps capsule Commonly known as: FLOMAX TAKE 1 CAPSULE DAILY AFTER SUPPER        Allergies:  Allergies  Allergen Reactions   Medical Provider Ez Flu Shot [Influenza Vac Split Quad]     No family history on file.  Social History:  reports that he has never smoked. He has never used smokeless tobacco. No history on file for alcohol use and drug use.  ROS: A complete review of systems was performed.  All systems are negative except for pertinent findings as noted.  Physical Exam:  Vital signs in last 24 hours: There were no vitals taken for this visit. Constitutional:  Alert and oriented, No acute distress Cardiovascular: Regular rate  Respiratory: Normal respiratory effort GI: Abdomen is soft, nontender, nondistended, no abdominal masses. No CVAT.  Genitourinary: Normal male phallus, testes are descended bilaterally and non-tender and without masses, scrotum is normal in appearance without lesions or masses, perineum is normal on inspection. Lymphatic: No lymphadenopathy Neurologic: Grossly intact, no focal deficits Psychiatric: Normal mood and affect  I have reviewed prior pt notes  I have reviewed notes from referring/previous physicians  I have reviewed urinalysis results  I have  independently reviewed prior imaging  I have reviewed prior PSA results  I have reviewed prior urine culture   Impression/Assessment:  ***  Plan:  ***

## 2021-05-22 ENCOUNTER — Ambulatory Visit: Payer: Medicare Other | Admitting: Urology

## 2021-05-22 DIAGNOSIS — C61 Malignant neoplasm of prostate: Secondary | ICD-10-CM

## 2021-06-23 IMAGING — US US GUIDANCE NEEDLE PLACEMENT
1 series · 14 of 21 positions shown · non-contrast
Comparison: None.

CLINICAL DATA: Prostate biopsy.

EXAM:
IR ULTRASOUND GUIDED ASPIRATION/DRAINAGE
TECHNIQUE: Ultrasound guidance was provided for prostate biopsy.

[Series 1: us guidance needle placement · 14 of 21 slices shown]
[im 1/21]
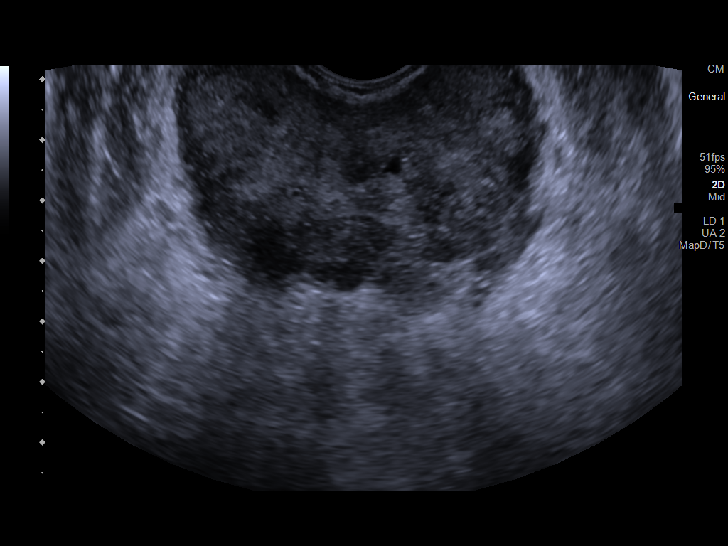
[im 3/21]
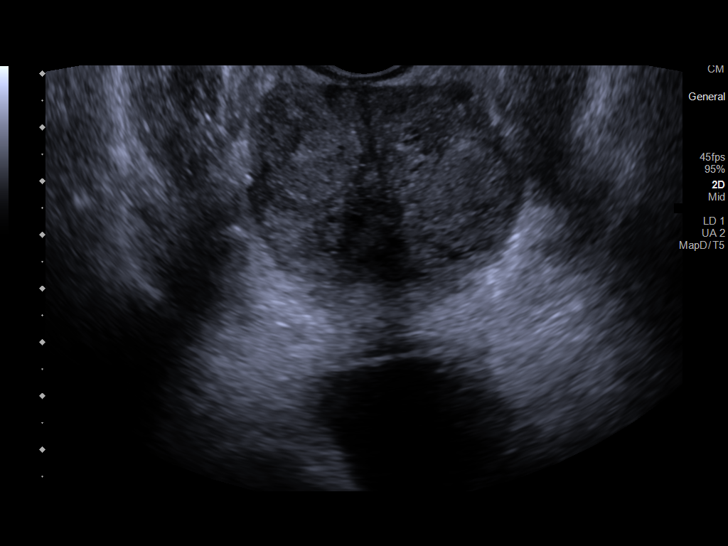
[im 4/21]
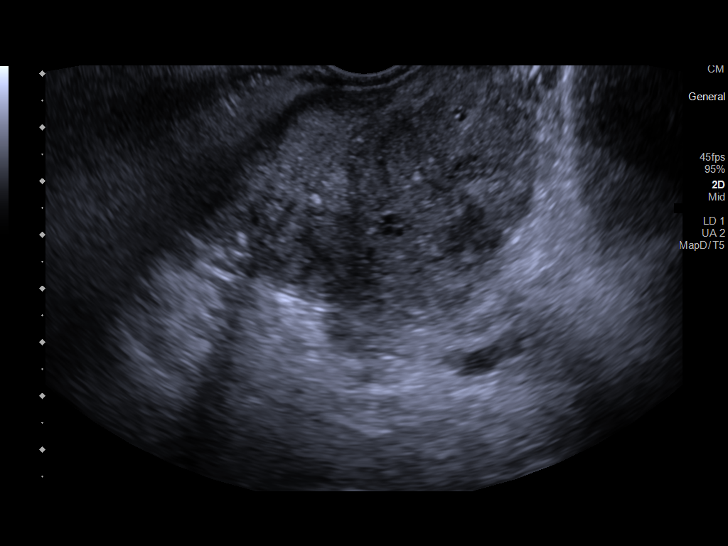
[im 6/21]
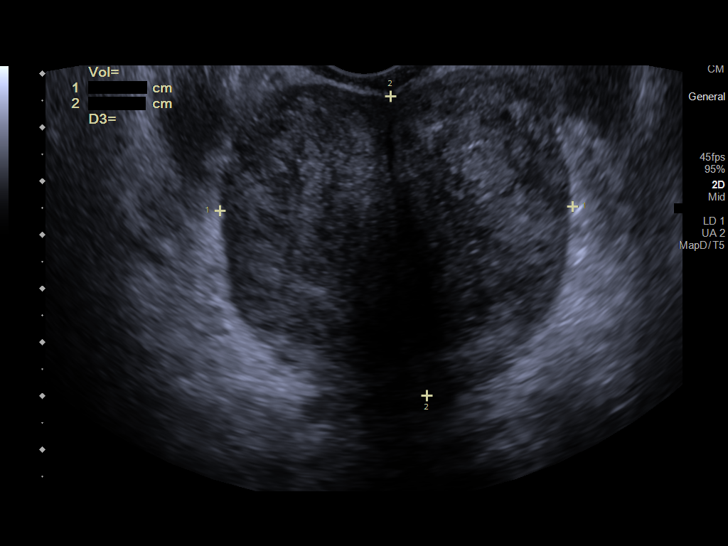
[im 7/21]
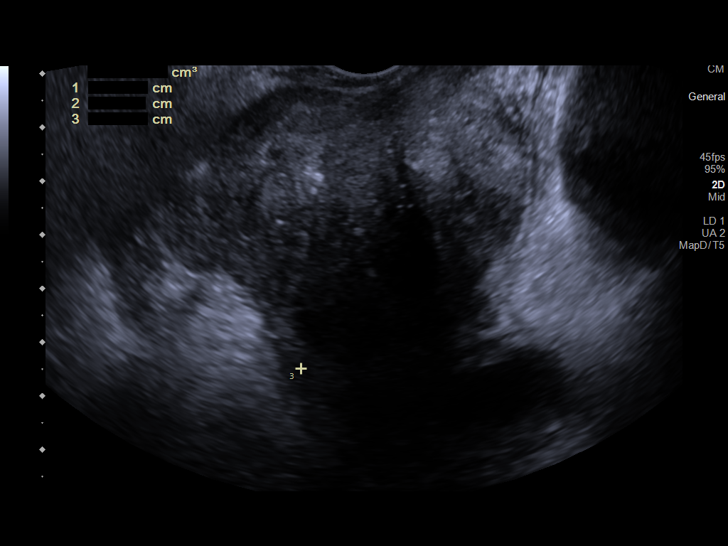
[im 9/21]
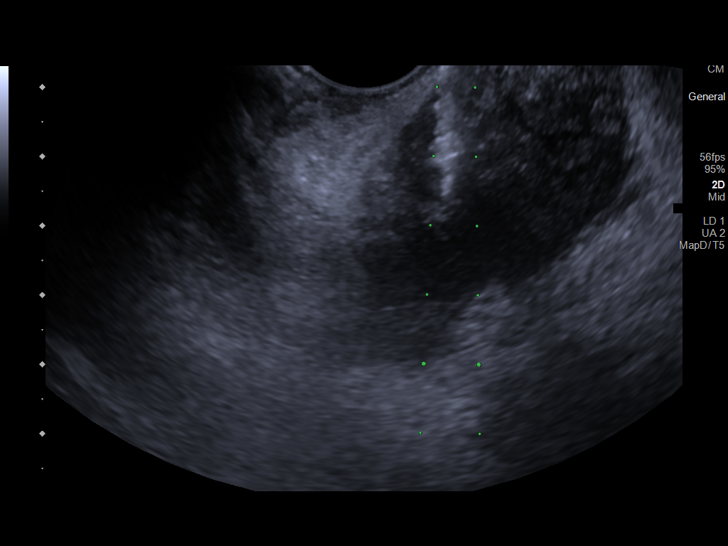
[im 10/21]
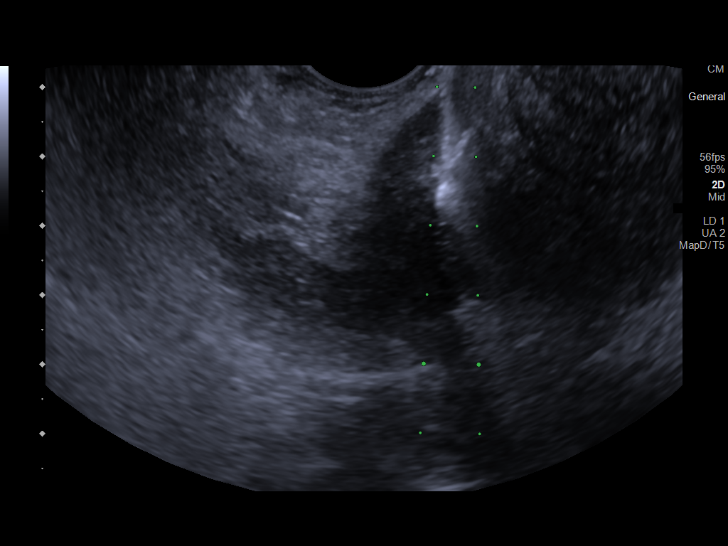
[im 12/21]
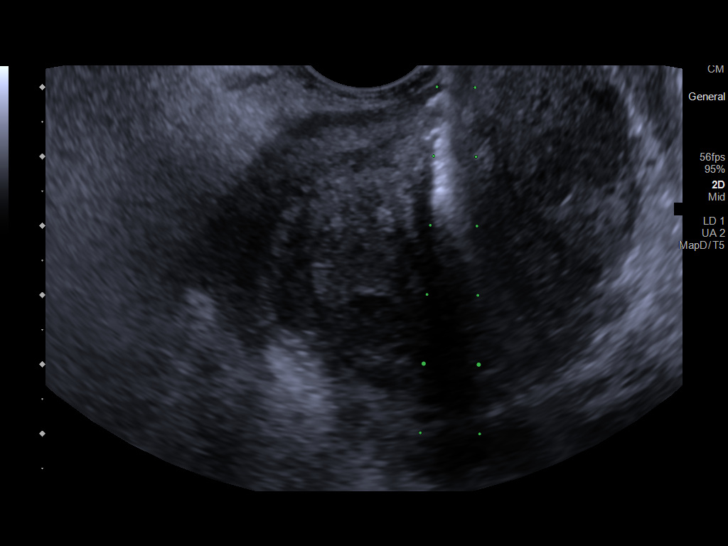
[im 13/21]
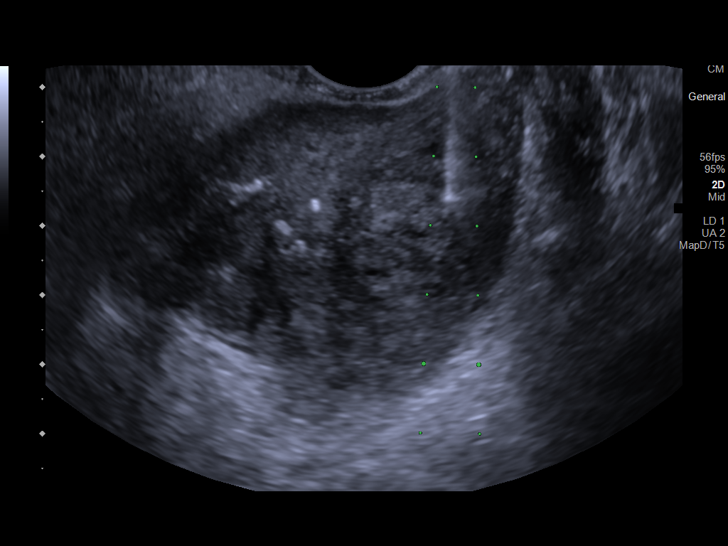
[im 15/21]
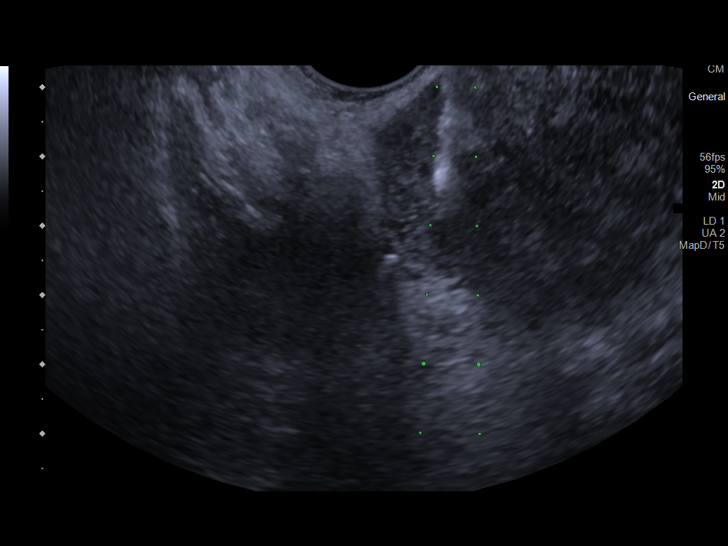
[im 16/21]
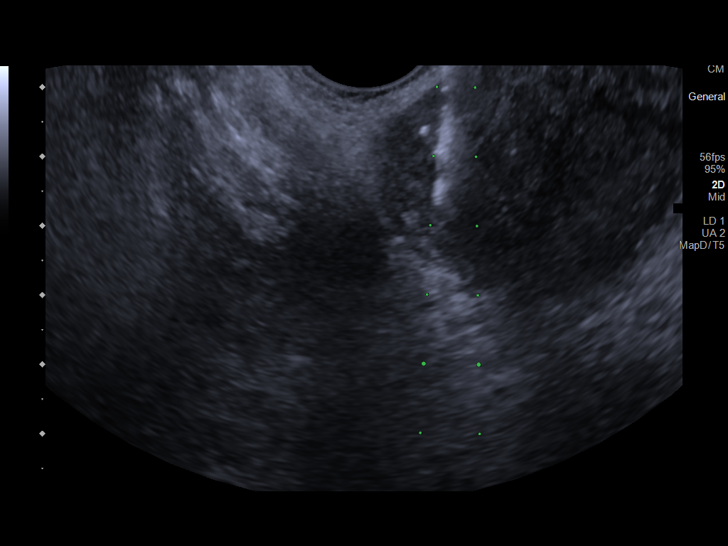
[im 18/21]
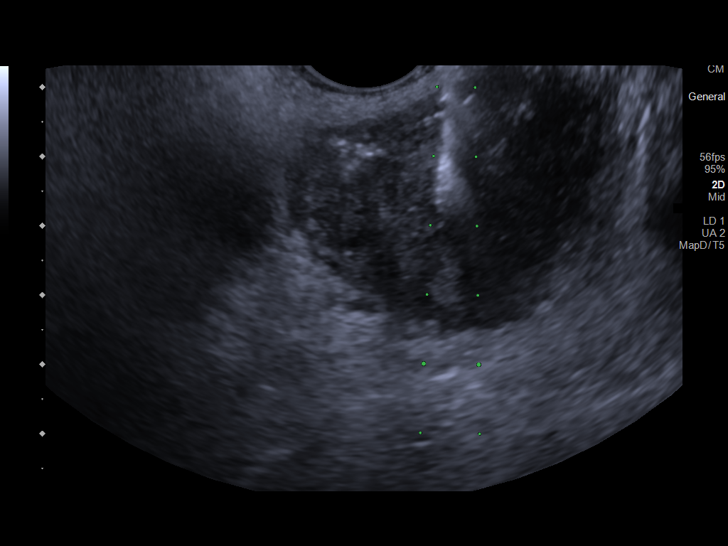
[im 19/21]
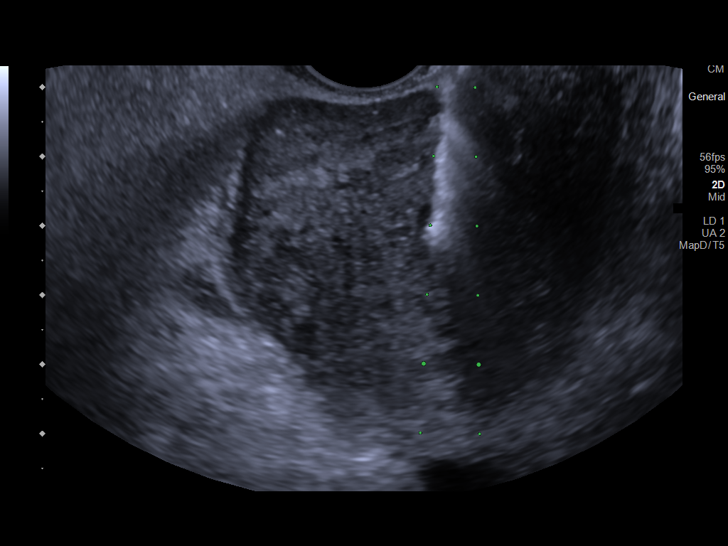
[im 21/21]
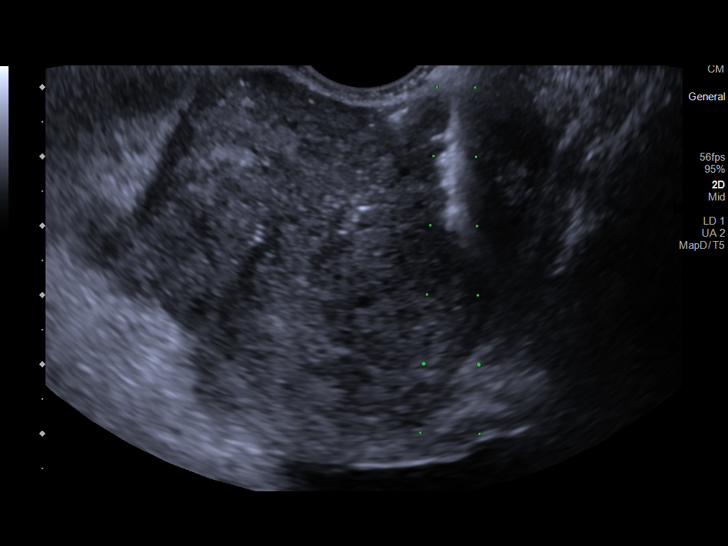

[14 of 21 positions shown; findings below may reference images not displayed]

FINDINGS: Ultrasound guidance was provided for prostate biopsy.
IMPRESSION: Ultrasound guidance was provided for prostate biopsy.

## 2021-06-25 NOTE — Progress Notes (Signed)
History of Present Illness:  ? ? ? ?BPH:  ? ?On tamsulosin. No nocturia.  IPSS 0, he is pleased with his quality of life. ?  ?  ?Prostate Cancer: ?He does not have the pathology report from his biopsy.  ?  ?I do not have complete records, but apparently the patient underwent ultrasound and biopsy of his prostate by Dr. Exie Parody before 2017. At that time, PSA was elevated around 15. Prostatic volume was 96 mL. I do not have results of pathology, but apparently he had GS 3+3 pattern.  ? ?He was referred for radiotherapy but it does not seem he ever underwent this. He has not been followed since 2017.  ? ?2.9.2021: Here today for follow-up. Repeat TRUSP/bx 2 mo ago, which revealed only 1/12 cores 10% positive for Gleason 3+3 cancer (very low risk group). Prostate volume 130 mL.  In May 2021 his PSA went up to 51.  It looks like he was treated for urinary tract infection at that time.  He has not had his PSA rechecked, or followed up here, as he said he was hospitalized for COVID. ?  ?ED: ?He does have problems obtaining and maintaining erections. No curvature of pain with erections. Libido is fairly normal. He would like to consider therapy. ?  ? ? ?  ?  ? ?Home Medications:  ?Allergies as of 06/26/2021   ? ?   Reactions  ? Medical Provider Ez Flu Shot [influenza Vac Split Quad]   ? ?  ? ?  ?Medication List  ?  ? ?  ? Accurate as of June 25, 2021  8:56 AM. If you have any questions, ask your nurse or doctor.  ?  ?  ? ?  ? ?omeprazole 20 MG capsule ?Commonly known as: PRILOSEC ?Take 20 mg by mouth daily. ?  ?sulfamethoxazole-trimethoprim 800-160 MG tablet ?Commonly known as: BACTRIM DS ?Take 1 tablet by mouth 2 (two) times daily. ?  ?tamsulosin 0.4 MG Caps capsule ?Commonly known as: FLOMAX ?TAKE 1 CAPSULE DAILY AFTER SUPPER ?  ? ?  ? ? ?Allergies:  ?Allergies  ?Allergen Reactions  ? Medical Provider Ez Flu Shot [Influenza Vac Split Quad]   ? ? ?No family history on file. ? ?Social History:  reports that he has never  smoked. He has never used smokeless tobacco. No history on file for alcohol use and drug use. ? ?ROS: ?A complete review of systems was performed.  All systems are negative except for pertinent findings as noted. ? ?Physical Exam:  ?Vital signs in last 24 hours: ?There were no vitals taken for this visit. ?Constitutional:  Alert and oriented, No acute distress ?Cardiovascular: Regular rate  ?Respiratory: Normal respiratory effort ?GI: Abdomen is soft, nontender, nondistended, no abdominal masses. No CVAT.  He has a large left inguinal hernia. ?Genitourinary: Normal male phallus, testes are descended bilaterally and non-tender and without masses, scrotum is normal in appearance without lesions or masses, perineum is normal on inspection.  Prostate greater than 120 g, symmetric, nonnodular, nontender.  Phallus is uncircumcised. ?Lymphatic: No lymphadenopathy ?Neurologic: Grossly intact, no focal deficits ?Psychiatric: Normal mood and affect ? ?I have reviewed prior pt notes ? ?I have reviewed notes from referring/previous physicians ? ?I have reviewed urinalysis results ? ?I have reviewed prior PSA/pathology results ? ? ? ?Impression/Assessment:  ?1.  BPH with very large gland, doing well on alfuzosin ? ?2.  Very low risk, low-volume prostate cancer.  Last followed in 2021.  Following his confirmatory biopsy he  did have his PSA increased to 51.  This may have been due to UTI/prostatitis ? ?Plan:  ?1.  PSA is rechecked today ? ?2.  If significantly elevated, we will need to proceed with MRI of prostate.  If back down to his "normal", I will just follow up in about a year ? ?

## 2021-06-26 ENCOUNTER — Other Ambulatory Visit: Payer: Self-pay

## 2021-06-26 ENCOUNTER — Encounter: Payer: Self-pay | Admitting: Urology

## 2021-06-26 ENCOUNTER — Ambulatory Visit (INDEPENDENT_AMBULATORY_CARE_PROVIDER_SITE_OTHER): Payer: Medicare Other | Admitting: Urology

## 2021-06-26 VITALS — BP 130/79 | HR 73

## 2021-06-26 DIAGNOSIS — N400 Enlarged prostate without lower urinary tract symptoms: Secondary | ICD-10-CM | POA: Diagnosis not present

## 2021-06-26 DIAGNOSIS — C61 Malignant neoplasm of prostate: Secondary | ICD-10-CM | POA: Diagnosis not present

## 2021-06-26 NOTE — Addendum Note (Signed)
Addended by: Dorisann Frames on: 06/26/2021 03:33 PM ? ? Modules accepted: Orders ? ?

## 2021-06-27 LAB — MICROSCOPIC EXAMINATION
Bacteria, UA: NONE SEEN
RBC, Urine: NONE SEEN /hpf (ref 0–2)
Renal Epithel, UA: NONE SEEN /hpf

## 2021-06-27 LAB — URINALYSIS, ROUTINE W REFLEX MICROSCOPIC
Bilirubin, UA: NEGATIVE
Glucose, UA: NEGATIVE
Nitrite, UA: NEGATIVE
Protein,UA: NEGATIVE
RBC, UA: NEGATIVE
Specific Gravity, UA: >1.03 — ABNORMAL HIGH (ref 1.005–1.030)
Urobilinogen, Ur: 0.2 mg/dL (ref 0.2–1.0)
pH, UA: 5.5 (ref 5.0–7.5)

## 2021-06-27 LAB — PSA: Prostate Specific Ag, Serum: 28 ng/mL — ABNORMAL HIGH (ref 0.0–4.0)

## 2021-07-03 ENCOUNTER — Other Ambulatory Visit: Payer: Self-pay | Admitting: Urology

## 2021-07-03 DIAGNOSIS — C61 Malignant neoplasm of prostate: Secondary | ICD-10-CM

## 2021-07-04 ENCOUNTER — Telehealth: Payer: Self-pay

## 2021-07-04 NOTE — Telephone Encounter (Signed)
Patient called and notified of results and scheduling will reach out to patient.  ?

## 2021-07-04 NOTE — Telephone Encounter (Signed)
-----   Message from Franchot Gallo, MD sent at 07/03/2021  8:48 AM EDT ----- ?Please call patient-PSA is still quite elevated at 28.  I would suggest that we send him for an MRI of the prostate to be done down in [place] if he needs more imaging.  I put that order in. ?----- Message ----- ?From: Dorisann Frames, RN ?Sent: 06/27/2021   4:34 PM EDT ?To: Franchot Gallo, MD ? ?Please review  ? ?

## 2021-07-25 ENCOUNTER — Inpatient Hospital Stay: Admission: RE | Admit: 2021-07-25 | Payer: Medicare Other | Source: Ambulatory Visit

## 2021-08-16 ENCOUNTER — Inpatient Hospital Stay: Admission: RE | Admit: 2021-08-16 | Payer: Medicare Other | Source: Ambulatory Visit

## 2021-10-01 DIAGNOSIS — R5383 Other fatigue: Secondary | ICD-10-CM | POA: Diagnosis not present

## 2021-10-01 DIAGNOSIS — Z0000 Encounter for general adult medical examination without abnormal findings: Secondary | ICD-10-CM | POA: Diagnosis not present

## 2021-10-01 DIAGNOSIS — Z79899 Other long term (current) drug therapy: Secondary | ICD-10-CM | POA: Diagnosis not present

## 2021-10-01 DIAGNOSIS — Z6824 Body mass index (BMI) 24.0-24.9, adult: Secondary | ICD-10-CM | POA: Diagnosis not present

## 2021-10-01 DIAGNOSIS — Z299 Encounter for prophylactic measures, unspecified: Secondary | ICD-10-CM | POA: Diagnosis not present

## 2021-10-01 DIAGNOSIS — Z1211 Encounter for screening for malignant neoplasm of colon: Secondary | ICD-10-CM | POA: Diagnosis not present

## 2021-10-01 DIAGNOSIS — Z7189 Other specified counseling: Secondary | ICD-10-CM | POA: Diagnosis not present

## 2021-10-01 DIAGNOSIS — E7800 Pure hypercholesterolemia, unspecified: Secondary | ICD-10-CM | POA: Diagnosis not present

## 2022-01-26 ENCOUNTER — Other Ambulatory Visit: Payer: Self-pay | Admitting: Urology

## 2022-01-30 ENCOUNTER — Telehealth: Payer: Self-pay | Admitting: *Deleted

## 2022-01-30 DIAGNOSIS — E7800 Pure hypercholesterolemia, unspecified: Secondary | ICD-10-CM

## 2022-01-30 NOTE — Patient Outreach (Signed)
  Care Coordination   Initial Visit Note   01/30/2022 Name: Nemiah Kissner MRN: [id number] DOB: 02-01-1946  Charlies Rayburn is a 76 y.o. year old male who sees Monico Blitz, MD for primary care. I spoke with  Vanetta Shawl by phone today.  What matters to the patients health and wellness today?  Doing well with management of chronic conditions.  Does not feel the need for further medical/nursing follow up but does request community resources.  Denies any urgent concerns, encouraged to contact this care manager with questions.      Goals Addressed             This Visit's Progress    COMPLETED: Care Coordination Activities - No follow up needed       Care Coordination Interventions: Patient interviewed about adult health maintenance status including  Pneumonia Vaccine Influenza Vaccine COVID vaccination    Regular eye checkups Regular Dental Care    Blood Pressure    SDOH assessment complete Discussed need for follow up with PCP (canceled last month's visit) Referral placed to community resource care guide for rent and utility resources         SDOH assessments and interventions completed:  Yes  SDOH Interventions Today    Flowsheet Row Most Recent Value  SDOH Interventions   Food Insecurity Interventions Intervention Not Indicated  Housing Interventions Other (Comment)  [Referral to care guide for community resources]  Transportation Interventions Intervention Not Indicated  Utilities Interventions Other (Comment)  [referral for community resources]        Care Coordination Interventions Activated:  Yes  Care Coordination Interventions:  Yes, provided   Follow up plan: Referral made to Care Guide for community resources    Encounter Outcome:  Pt. Visit Completed   Valente David, RN, MSN, [facility] Care Management Coordinator [phone]

## 2022-01-31 ENCOUNTER — Telehealth: Payer: Self-pay | Admitting: *Deleted

## 2022-01-31 NOTE — Telephone Encounter (Signed)
   Telephone encounter was:  Unsuccessful.  01/31/2022 Name: Danny Jordan MRN: [id number] DOB: Jun 25, 1945  Unsuccessful outbound call made today to assist with:  Transportation Needs  and Food Insecurity  Outreach Attempt:  1st Attempt  A HIPAA compliant voice message was left requesting a return call.  Instructed patient to call back at [phone].  [place] [phone] [address] , [place] [address] Email : Ashby Dawes. Greenauer-moran [email or web address]

## 2022-02-05 ENCOUNTER — Telehealth: Payer: Self-pay | Admitting: *Deleted

## 2022-02-05 NOTE — Telephone Encounter (Signed)
   Telephone encounter was:  Unsuccessful.  02/05/2022 Name: Danny Jordan MRN: [id number] DOB: 01/22/1946  Unsuccessful outbound call made today to assist with:  Financial Difficulties related to rent  Outreach Attempt:  2nd Attempt  A HIPAA compliant voice message was left requesting a return call.  Instructed patient to call back at [phone].  [place] [phone] [address] , [place] [address] Email : Ashby Dawes. Greenauer-moran [email or web address]

## 2022-02-06 ENCOUNTER — Telehealth: Payer: Self-pay | Admitting: *Deleted

## 2022-02-06 NOTE — Telephone Encounter (Signed)
   Telephone encounter was:  Unsuccessful.  02/06/2022 Name: Danny Jordan MRN: [id number] DOB: [birth year]  Unsuccessful outbound call made today to assist with:   rent and utility   Outreach Attempt:  2nd Attempt  A HIPAA compliant voice message was left requesting a return call.  Instructed patient to call back at [phone].  [place] [phone] [address] , [place] [address] Email : Ashby Dawes. Greenauer-moran [email or web address]

## 2022-02-08 ENCOUNTER — Telehealth: Payer: Self-pay | Admitting: *Deleted

## 2022-02-08 NOTE — Telephone Encounter (Signed)
   Telephone encounter was:  Unsuccessful.  02/08/2022 Name: Danny Jordan MRN: [id number] DOB: Jan 12, 1946  Unsuccessful outbound call made today to assist with:  Food Insecurity  Outreach Attempt:  2nd Attempt  A HIPAA compliant voice message was left requesting a return call.  Instructed patient to call back at [phone].  [place] [phone] [address] , [place] [address] Email : Ashby Dawes. Greenauer-moran [email or web address]

## 2022-02-11 ENCOUNTER — Telehealth: Payer: Self-pay | Admitting: *Deleted

## 2022-02-11 NOTE — Telephone Encounter (Signed)
   Telephone encounter was:  Successful.  02/11/2022 Name: Danny Jordan MRN: [id number] DOB: Jun 04, 1945  Danny Jordan is a 76 y.o. year old male who is a primary care patient of Monico Blitz, MD . The community resource team was consulted for assistance with Food Insecurity and Financial Difficulties related to rent  Care guide performed the following interventions: Patient provided with information about care guide support team and interviewed to confirm resource needs. Rent is 1200 and there is a few months behind . The person is a few months behind.informaed patient the his friend would need to file to get SSA in her name and all the billsare in her name so should would need to go to dss for help, Will mail food banks and housing lists  Follow Up Plan:  No further follow up planned at this time. The patient has been provided with needed resources.  [place] [phone] [address] , [place] [address] Email : Ashby Dawes. Greenauer-moran '@[email or web address]

## 2022-07-02 ENCOUNTER — Ambulatory Visit: Payer: 59 | Admitting: Urology

## 2022-07-30 ENCOUNTER — Ambulatory Visit: Payer: 59 | Admitting: Urology

## 2022-09-10 ENCOUNTER — Ambulatory Visit: Payer: 59 | Admitting: Urology

## 2022-10-02 DIAGNOSIS — H25812 Combined forms of age-related cataract, left eye: Secondary | ICD-10-CM | POA: Diagnosis not present

## 2022-10-02 DIAGNOSIS — H40013 Open angle with borderline findings, low risk, bilateral: Secondary | ICD-10-CM | POA: Diagnosis not present

## 2022-10-02 DIAGNOSIS — Z961 Presence of intraocular lens: Secondary | ICD-10-CM | POA: Diagnosis not present

## 2022-10-08 ENCOUNTER — Other Ambulatory Visit: Payer: Self-pay | Admitting: Urology

## 2022-11-01 DIAGNOSIS — Z7189 Other specified counseling: Secondary | ICD-10-CM | POA: Diagnosis not present

## 2022-11-01 DIAGNOSIS — E7800 Pure hypercholesterolemia, unspecified: Secondary | ICD-10-CM | POA: Diagnosis not present

## 2022-11-01 DIAGNOSIS — Z0000 Encounter for general adult medical examination without abnormal findings: Secondary | ICD-10-CM | POA: Diagnosis not present

## 2022-11-01 DIAGNOSIS — Z79899 Other long term (current) drug therapy: Secondary | ICD-10-CM | POA: Diagnosis not present

## 2022-11-01 DIAGNOSIS — Z299 Encounter for prophylactic measures, unspecified: Secondary | ICD-10-CM | POA: Diagnosis not present

## 2022-11-01 DIAGNOSIS — R5383 Other fatigue: Secondary | ICD-10-CM | POA: Diagnosis not present

## 2022-11-01 DIAGNOSIS — Z1211 Encounter for screening for malignant neoplasm of colon: Secondary | ICD-10-CM | POA: Diagnosis not present

## 2022-11-18 NOTE — Progress Notes (Incomplete)
History of Present Illness:   BPH:   On tamsulosin. No nocturia.  IPSS 0, he is pleased with his quality of life.     Prostate Cancer: He does not have the pathology report from his biopsy.    I do not have complete records, but apparently the patient underwent ultrasound and biopsy of his prostate by Dr. Nechama Guard before 2017. At that time, PSA was elevated around 15. Prostatic volume was 96 mL. I do not have results of pathology, but apparently he had GS 3+3 pattern.   He was referred for radiotherapy but it does not seem he ever underwent this. He has not been followed since 2017.   2.9.2021: Here today for follow-up. Repeat TRUSP/bx 2 mo ago, which revealed only 1/12 cores 10% positive for Gleason 3+3 cancer (very low risk group). Prostate volume 130 mL.  In May 2021 his PSA went up to 51.  It looks like he was treated for urinary tract infection at that time.  He has not had his PSA rechecked, or followed up here, as he said he was hospitalized for COVID.  3.21.2023: PSA 28.  It was recommended that the patient had a prostate MRI.  This was never performed.   ED: He does have problems obtaining and maintaining erections. No curvature of pain with erections. Libido is fairly normal. He would like to consider therapy.   8.13.2024:   No past medical history on file.  No past surgical history on file.  Home Medications:  Allergies as of 11/19/2022       Reactions   Medical Provider Ez Flu Shot [influenza Vac Split Quad]         Medication List        Accurate as of November 18, 2022 12:13 PM. If you have any questions, ask your nurse or doctor.          omeprazole 20 MG capsule Commonly known as: PRILOSEC Take 20 mg by mouth daily.   pravastatin 20 MG tablet Commonly known as: PRAVACHOL Take 20 mg by mouth daily.   sulfamethoxazole-trimethoprim 800-160 MG tablet Commonly known as: BACTRIM DS Take 1 tablet by mouth 2 (two) times daily.   tamsulosin 0.4 MG Caps  capsule Commonly known as: FLOMAX TAKE 1 CAPSULE BY MOUTH DAILY  AFTER SUPPER        Allergies:  Allergies  Allergen Reactions   Medical Provider Ez Flu Shot [Influenza Vac Split Quad]     No family history on file.  Social History:  reports that he has never smoked. He has never used smokeless tobacco. No history on file for alcohol use and drug use.  ROS: A complete review of systems was performed.  All systems are negative except for pertinent findings as noted.  Physical Exam:  Vital signs in last 24 hours: There were no vitals taken for this visit. Constitutional:  Alert and oriented, No acute distress Cardiovascular: Regular rate  Respiratory: Normal respiratory effort GI: Abdomen is soft, nontender, nondistended, no abdominal masses. No CVAT.  Genitourinary: Normal male phallus, testes are descended bilaterally and non-tender and without masses, scrotum is normal in appearance without lesions or masses, perineum is normal on inspection. Lymphatic: No lymphadenopathy Neurologic: Grossly intact, no focal deficits Psychiatric: Normal mood and affect  I have reviewed prior pt notes  I have reviewed notes from referring/previous physicians  I have reviewed urinalysis results  I have independently reviewed prior imaging  I have reviewed prior PSA results  I have  reviewed prior urine culture   Impression/Assessment:  ***  Plan:  ***

## 2022-11-19 ENCOUNTER — Ambulatory Visit: Payer: 59 | Admitting: Urology

## 2022-11-19 DIAGNOSIS — C61 Malignant neoplasm of prostate: Secondary | ICD-10-CM

## 2022-11-19 DIAGNOSIS — N400 Enlarged prostate without lower urinary tract symptoms: Secondary | ICD-10-CM

## 2022-12-24 ENCOUNTER — Ambulatory Visit: Payer: 59 | Admitting: Urology

## 2023-01-03 NOTE — Progress Notes (Signed)
History of Present Illness: 77 yo male presents for  contiued f/u of the below issues:  BPH:   He had prostate measurement of 130 mL on last prostate U/S.On tamsulosin.      Prostate Cancer:   I do not have complete records, but apparently the patient underwent ultrasound and biopsy of his prostate by Dr. Nechama Guard before 2017. At that time, PSA was elevated around 15. Prostatic volume was 96 mL. I do not have results of pathology, but apparently he had GS 3+3 pattern.   He was referred for radiotherapy but it does not seem he underwent this.   2.9.2021:  Repeat TRUSP/bx. Path-- only 1/12 cores 10% positive for Gleason 3+3 cancer (very low risk group). Prostate volume 130 mL.   In May 2021 his PSA went up to 51.  It looks like he was treated for urinary tract infection at that time.  He has not had his PSA rechecked, or followed up here, as he said he was hospitalized for COVID.  3.21.2023-PSA 28  ED: He has been on sildenafil in the past  9.30.2024: First visit here in well over a year.  He has not had PSA checked.  He is still on tamsulosin.  He denies significant lower urinary tract symptoms.  No blood in his urine.  No past medical history on file.  No past surgical history on file.  Home Medications:  Allergies as of 01/06/2023       Reactions   Medical Provider Ez Flu Shot [influenza Vac Split Quad]         Medication List        Accurate as of January 03, 2023 10:27 AM. If you have any questions, ask your nurse or doctor.          omeprazole 20 MG capsule Commonly known as: PRILOSEC Take 20 mg by mouth daily.   pravastatin 20 MG tablet Commonly known as: PRAVACHOL Take 20 mg by mouth daily.   sulfamethoxazole-trimethoprim 800-160 MG tablet Commonly known as: BACTRIM DS Take 1 tablet by mouth 2 (two) times daily.   tamsulosin 0.4 MG Caps capsule Commonly known as: FLOMAX TAKE 1 CAPSULE BY MOUTH DAILY  AFTER SUPPER        Allergies:  Allergies   Allergen Reactions   Medical Provider Ez Flu Shot [Influenza Vac Split Quad]     No family history on file.  Social History:  reports that he has never smoked. He has never used smokeless tobacco. No history on file for alcohol use and drug use.  ROS: A complete review of systems was performed.  All systems are negative except for pertinent findings as noted.  Physical Exam:  Vital signs in last 24 hours: There were no vitals taken for this visit. Constitutional:  Alert and oriented, No acute distress Cardiovascular: Regular rate  Respiratory: Normal respiratory effor Genitourinary: Normal anal tone.  Prostate 100 g, symmetric, nonnodular, nontender. Lymphatic: No lymphadenopathy Neurologic: Grossly intact, no focal deficits Psychiatric: Normal mood and affect  I have reviewed prior pt notes  I have reviewed urinalysis results  I have independently reviewed prior imaging-prostate ultrasound volume  I have reviewed prior PSA and pathology results     Impression/Assessment:  Grade group 1 prostate cancer, status post 2 prior biopsies.  Last confirmatory biopsy was about 3 years ago.  He has had in the 20s but he had been treated for recent infection.  BPH, symptoms stable on tamsulosin  ED, he would like refill  of Viagra  Plan:  1.  Sildenafil sent in  2.  PSA checked today  3.  Default appointment in 6 months but if significant change in an upward pattern with PSA he will need further imaging/biopsies

## 2023-01-06 ENCOUNTER — Encounter: Payer: Self-pay | Admitting: Urology

## 2023-01-06 ENCOUNTER — Ambulatory Visit (INDEPENDENT_AMBULATORY_CARE_PROVIDER_SITE_OTHER): Payer: 59 | Admitting: Urology

## 2023-01-06 VITALS — BP 130/84 | HR 68

## 2023-01-06 DIAGNOSIS — N400 Enlarged prostate without lower urinary tract symptoms: Secondary | ICD-10-CM

## 2023-01-06 DIAGNOSIS — C61 Malignant neoplasm of prostate: Secondary | ICD-10-CM | POA: Diagnosis not present

## 2023-01-06 DIAGNOSIS — N401 Enlarged prostate with lower urinary tract symptoms: Secondary | ICD-10-CM | POA: Diagnosis not present

## 2023-01-06 DIAGNOSIS — N5201 Erectile dysfunction due to arterial insufficiency: Secondary | ICD-10-CM | POA: Diagnosis not present

## 2023-01-06 LAB — URINALYSIS, ROUTINE W REFLEX MICROSCOPIC
Bilirubin, UA: NEGATIVE
Glucose, UA: NEGATIVE
Ketones, UA: NEGATIVE
Leukocytes,UA: NEGATIVE
Nitrite, UA: NEGATIVE
Protein,UA: NEGATIVE
RBC, UA: NEGATIVE
Specific Gravity, UA: 1.03 (ref 1.005–1.030)
Urobilinogen, Ur: 0.2 mg/dL (ref 0.2–1.0)
pH, UA: 6 (ref 5.0–7.5)

## 2023-01-06 LAB — BLADDER SCAN AMB NON-IMAGING: Scan Result: 0

## 2023-01-06 MED ORDER — SILDENAFIL CITRATE 100 MG PO TABS: ORAL_TABLET | ORAL | 99 refills | Status: AC

## 2023-01-07 LAB — PSA: Prostate Specific Ag, Serum: 22.3 ng/mL — ABNORMAL HIGH (ref 0.0–4.0)

## 2023-01-09 DIAGNOSIS — Z0000 Encounter for general adult medical examination without abnormal findings: Secondary | ICD-10-CM | POA: Diagnosis not present

## 2023-01-09 DIAGNOSIS — Z299 Encounter for prophylactic measures, unspecified: Secondary | ICD-10-CM | POA: Diagnosis not present

## 2023-01-14 ENCOUNTER — Other Ambulatory Visit: Payer: Self-pay | Admitting: Urology

## 2023-01-14 DIAGNOSIS — C61 Malignant neoplasm of prostate: Secondary | ICD-10-CM

## 2023-01-15 ENCOUNTER — Telehealth: Payer: Self-pay

## 2023-01-15 DIAGNOSIS — C61 Malignant neoplasm of prostate: Secondary | ICD-10-CM

## 2023-01-15 MED ORDER — LEVOFLOXACIN 750 MG PO TABS
750.0000 mg | ORAL_TABLET | Freq: Once | ORAL | 0 refills | Status: AC
Start: 2023-01-15 — End: 2023-01-15

## 2023-01-15 NOTE — Addendum Note (Signed)
Addended by: Christoper Fabian R on: 01/15/2023 10:36 AM   Modules accepted: Orders

## 2023-01-15 NOTE — Telephone Encounter (Signed)
-----   Message from Bertram Millard Dahlstedt sent at 01/14/2023 12:43 PM EDT ----- Please call patient.  PSA is still elevated at 22, but down from its prior level of around 50.  I would recommend a repeat MRI of his prostate.  I have put that order in, please work on scheduling. ----- Message ----- From: Interface, [facility] Lab Results In Sent: 01/06/2023   3:36 PM EDT To: Marcine Matar, MD

## 2023-01-15 NOTE — Telephone Encounter (Signed)
Patient is made aware and scheduled for biopsy. Patient voiced understanding.

## 2023-03-17 NOTE — Progress Notes (Incomplete)
This 77 year old male comes in today for surveillance ultrasound and biopsy of his prostate.  Initial diagnosis of prostate cancer was made in 2017.  He had grade group 1 prostate cancer, low-volume.  Last surveillance biopsy was in early 2021.  Again, he had small volume grade group 1 disease.  Most recent PSA was 22.3.  Risks, benefits, and some of the potential complications of a transrectal ultrasounds of the prostate (TRUSP) with biopsies were discussed at length with the patient including gross hematuria, blood in the bowel movements, hematospermia, bacteremia, infection, voiding discomfort, urinary retention, fever, chills, sepsis, blood transfusion, death, and others. All questions were answered. Informed consent was obtained. The patient confirmed that he had taken his pre-procedure antibiotic. All anticoagulants were discontinued prior to the procedure. The patient emptied his bladder. He was positioned in a comfortable left lateral decubitus position with hips and knees acutely flexed.  The rectal probe was inserted into the rectum without difficulty. 10cc of 2% Lidocaine without epinephrine was instilled with a spinal needle using ultrasound guidance near the junction of each seminal vesicle and the prostate.  Sequential transverse (axial) scans were made in small increments beginning at the seminal vesicles and ending at the prostatic apex. Sequential longitudinal (saggital) scans were made in small increments beginning at the right lateral prostate and ending at the left lateral prostate. Excellent anatomical imaging was obtained. The peripheral, transitional, and central zones were well-defined. The seminal vesicles were normal.  Prostate volume *** ml.  There were no hypoechoic areas. 12 needle core biopsies were performed. 1 biopsy each was taken from the following areas:  Right lateral base, right medial base, right lateral mid prostate, right medial mid prostate, right lateral  apical prostate, right medial apical prostate, left lateral base, left medial base, left lateral mid prostate, left medial mid prostate, left lateral apical prostate, left medial apical prostate.. Minimal prostatic calcifications were noted. Excellent biopsy specimens were obtained.  Follow-up rectal examination was unremarkable. The procedure was well-tolerated and without complications. Antibiotic instructions were given. The patient was told that:  For several days:  he should increase his fluid intake and limit strenuous activity  he might have mild discomfort at the base of his penis or in his rectum  he might have blood in his urine or blood in his bowel movements  For 2-3 months:  he might have blood in his ejaculate (semen)  Instructions were given to call the office immedicately for blood clots in the urine or bowel movements, difficulty urinating, inability to urinate, urinary retention, painful or frequent urination, fever, chills, nausea, vomiting, or other illness. The patient stated that he understood these instructions and would comply with them. We told the patient that prostate biopsy pathology reports are usually available within 3-5 working days, unless a pathologic second opinion is required, which may take 7-14 days. We told him to contact us to check on the status of his biopsy if he has not heard from [place] within 7 days. The patient left the ultrasound examination room in stable condition.

## 2023-03-18 ENCOUNTER — Other Ambulatory Visit: Payer: Self-pay | Admitting: Urology

## 2023-03-18 ENCOUNTER — Ambulatory Visit (HOSPITAL_COMMUNITY): Admission: RE | Admit: 2023-03-18 | Payer: 59 | Source: Ambulatory Visit

## 2023-03-18 ENCOUNTER — Other Ambulatory Visit: Payer: 59 | Admitting: Urology

## 2023-03-18 DIAGNOSIS — C61 Malignant neoplasm of prostate: Secondary | ICD-10-CM

## 2023-05-23 ENCOUNTER — Other Ambulatory Visit: Payer: Self-pay | Admitting: Urology

## 2023-06-01 NOTE — Progress Notes (Signed)
Risks, benefits, and some of the potential complications of a transrectal ultrasounds of the prostate (TRUSP) with biopsies were discussed at length with the patient including gross hematuria, blood in the bowel movements, hematospermia, bacteremia, infection, voiding discomfort, urinary retention, fever, chills, sepsis, blood transfusion, death, and others. All questions were answered. Informed consent was obtained. The patient confirmed that he had taken his pre-procedure antibiotic. All anticoagulants were discontinued prior to the procedure. The patient emptied his bladder. He was positioned in a comfortable left lateral decubitus position with hips and knees acutely flexed.  The rectal probe was inserted into the rectum without difficulty. 10cc of 2% Lidocaine without epinephrine was instilled with a spinal needle using ultrasound guidance near the junction of each seminal vesicle and the prostate.  Sequential transverse (axial) scans were made in small increments beginning at the seminal vesicles and ending at the prostatic apex. Sequential longitudinal (saggital) scans were made in small increments beginning at the right lateral prostate and ending at the left lateral prostate. Excellent anatomical imaging was obtained. The peripheral, transitional, and central zones were well-defined. The seminal vesicles were normal.  Prostate volume *** ml.  There were no hypoechoic areas. 12 needle core biopsies were performed. 1 biopsy each was taken from the following areas:  Right lateral base, right medial base, right lateral mid prostate, right medial mid prostate, right lateral apical prostate, right medial apical prostate, left lateral base, left medial base, left lateral mid prostate, left medial mid prostate, left lateral apical prostate, left medial apical prostate.. Minimal prostatic calcifications were noted. Excellent biopsy specimens were obtained.  Follow-up rectal examination was unremarkable.  The procedure was well-tolerated and without complications. Antibiotic instructions were given. The patient was told that:  For several days:  he should increase his fluid intake and limit strenuous activity  he might have mild discomfort at the base of his penis or in his rectum  he might have blood in his urine or blood in his bowel movements  For 2-3 months:  he might have blood in his ejaculate (semen)  Instructions were given to call the office immedicately for blood clots in the urine or bowel movements, difficulty urinating, inability to urinate, urinary retention, painful or frequent urination, fever, chills, nausea, vomiting, or other illness. The patient stated that he understood these instructions and would comply with them. We told the patient that prostate biopsy pathology reports are usually available within 3-5 working days, unless a pathologic second opinion is required, which may take 7-14 days. We told him to contact us to check on the status of his biopsy if he has not heard from us within 7 days. The patient left the ultrasound examination room in stable condition.    

## 2023-06-03 ENCOUNTER — Other Ambulatory Visit: Payer: Self-pay | Admitting: Urology

## 2023-06-03 ENCOUNTER — Ambulatory Visit (HOSPITAL_BASED_OUTPATIENT_CLINIC_OR_DEPARTMENT_OTHER): Payer: 59 | Admitting: Urology

## 2023-06-03 ENCOUNTER — Encounter (HOSPITAL_COMMUNITY): Payer: Self-pay

## 2023-06-03 ENCOUNTER — Ambulatory Visit (HOSPITAL_COMMUNITY)
Admission: RE | Admit: 2023-06-03 | Discharge: 2023-06-03 | Disposition: A | Discharge status: Home / Self Care | Payer: 59 | Source: Ambulatory Visit | Attending: Urology | Admitting: Urology

## 2023-06-03 VITALS — BP 170/99 | HR 84 | Temp 98.2°F | Resp 18

## 2023-06-03 DIAGNOSIS — N401 Enlarged prostate with lower urinary tract symptoms: Secondary | ICD-10-CM

## 2023-06-03 DIAGNOSIS — Z8546 Personal history of malignant neoplasm of prostate: Secondary | ICD-10-CM | POA: Diagnosis not present

## 2023-06-03 DIAGNOSIS — R972 Elevated prostate specific antigen [PSA]: Secondary | ICD-10-CM | POA: Diagnosis not present

## 2023-06-03 DIAGNOSIS — C61 Malignant neoplasm of prostate: Secondary | ICD-10-CM | POA: Diagnosis present

## 2023-06-03 MED ORDER — LIDOCAINE HCL (PF) 2 % IJ SOLN
INTRAMUSCULAR | Status: AC
  Filled 2023-06-03: qty 10

## 2023-06-03 MED ORDER — LIDOCAINE HCL (PF) 1 % IJ SOLN
INTRAMUSCULAR | Status: AC
  Filled 2023-06-03: qty 5

## 2023-06-03 MED ORDER — CEFTRIAXONE SODIUM 1 G IJ SOLR
INTRAMUSCULAR | Status: AC
  Filled 2023-06-03: qty 10

## 2023-06-03 MED ORDER — LIDOCAINE HCL (PF) 2 % IJ SOLN
10.0000 mL | Freq: Once | INTRAMUSCULAR | Status: AC
  Administered 2023-06-03: 10 mL

## 2023-06-03 MED ORDER — CEFTRIAXONE SODIUM 1 G IJ SOLR
1.0000 g | Freq: Once | INTRAMUSCULAR | Status: AC
  Administered 2023-06-03: 1 g via INTRAMUSCULAR

## 2023-06-03 MED ORDER — LIDOCAINE HCL (PF) 1 % IJ SOLN
2.1000 mL | Freq: Once | INTRAMUSCULAR | Status: AC
  Administered 2023-06-03: 2.1 mL

## 2023-06-03 NOTE — Progress Notes (Signed)
PT tolerated prostate biopsy procedure and antibiotic injection well today. Labs obtained and sent for pathology by Richard from ultrasound. PT ambulatory at discharge with no acute distress noted and verbalized understanding of discharge instructions.

## 2023-06-04 LAB — SURGICAL PATHOLOGY

## 2023-06-11 ENCOUNTER — Telehealth: Payer: Self-pay

## 2023-06-11 NOTE — Telephone Encounter (Signed)
 Called Pt to relay message from MD Pt voiced understanding and we rescheduled his f/u for a year out

## 2023-06-11 NOTE — Telephone Encounter (Signed)
-----   Message from [facility] sent at 06/11/2023  9:21 AM EST ----- Regarding: Follow up Please follow up with patient. ----- Message ----- From: Marcine Matar, MD Sent: 06/09/2023   3:30 PM EST To: Troy Sine, CMA  Please call patient-tell him that all cores that we took at the biopsy were benign, no cancer was seen.  That is excellent news.  It looks like he has an appointment to see me on the 25th.  I do not think he needs to see me then, I think all he needs is an appointment in a year for repeat check. ----- Message ----- From: [facility], [facility] Sent: 06/04/2023   9:42 AM EST To: Marcine Matar, MD

## 2023-07-01 ENCOUNTER — Ambulatory Visit: Payer: 59 | Admitting: Urology

## 2023-07-07 ENCOUNTER — Ambulatory Visit: Payer: 59 | Admitting: Urology

## 2023-08-04 ENCOUNTER — Ambulatory Visit (HOSPITAL_COMMUNITY): Admission: RE | Admit: 2023-08-04 | Source: Ambulatory Visit

## 2024-01-14 ENCOUNTER — Encounter: Payer: Self-pay | Admitting: *Deleted

## 2024-03-03 ENCOUNTER — Other Ambulatory Visit: Payer: Self-pay | Admitting: Urology

## 2024-06-15 ENCOUNTER — Ambulatory Visit: Admitting: Urology
